# Patient Record
Sex: Male | Born: 1980 | Race: White | Hispanic: No | Marital: Single | Smoking: Current every day smoker
Health system: Southern US, Community
[De-identification: ages and names within clinical notes are randomized; demographics above are authoritative.]

## PROBLEM LIST (undated history)

## (undated) DIAGNOSIS — E119 Type 2 diabetes mellitus without complications: Secondary | ICD-10-CM

## (undated) HISTORY — PX: ANKLE SURGERY: SHX546

---

## 2005-02-04 ENCOUNTER — Inpatient Hospital Stay: Payer: Self-pay | Admitting: Otolaryngology

## 2005-12-26 ENCOUNTER — Emergency Department: Payer: Self-pay | Admitting: Emergency Medicine

## 2006-05-13 ENCOUNTER — Emergency Department: Payer: Self-pay | Admitting: Emergency Medicine

## 2018-07-10 ENCOUNTER — Encounter (HOSPITAL_COMMUNITY): Payer: Self-pay | Admitting: Emergency Medicine

## 2018-07-10 ENCOUNTER — Other Ambulatory Visit: Payer: Self-pay

## 2018-07-10 ENCOUNTER — Emergency Department (HOSPITAL_COMMUNITY): Payer: Self-pay

## 2018-07-10 ENCOUNTER — Emergency Department (HOSPITAL_COMMUNITY)
Admission: EM | Admit: 2018-07-10 | Discharge: 2018-07-10 | Disposition: A | Discharge status: Home / Self Care | Payer: Self-pay | Attending: Emergency Medicine | Admitting: Emergency Medicine

## 2018-07-10 DIAGNOSIS — M549 Dorsalgia, unspecified: Secondary | ICD-10-CM | POA: Insufficient documentation

## 2018-07-10 DIAGNOSIS — F17210 Nicotine dependence, cigarettes, uncomplicated: Secondary | ICD-10-CM | POA: Insufficient documentation

## 2018-07-10 DIAGNOSIS — R1011 Right upper quadrant pain: Secondary | ICD-10-CM | POA: Insufficient documentation

## 2018-07-10 LAB — CBC WITH DIFFERENTIAL/PLATELET
Basophils Absolute: 0.1 10*3/uL (ref 0.0–0.1)
Basophils Relative: 1 %
EOS PCT: 5 %
Eosinophils Absolute: 0.3 10*3/uL (ref 0.0–0.7)
HEMATOCRIT: 42.6 % (ref 39.0–52.0)
Hemoglobin: 14.6 g/dL (ref 13.0–17.0)
LYMPHS ABS: 1.9 10*3/uL (ref 0.7–4.0)
LYMPHS PCT: 26 %
MCH: 31 pg (ref 26.0–34.0)
MCHC: 34.3 g/dL (ref 30.0–36.0)
MCV: 90.4 fL (ref 78.0–100.0)
MONO ABS: 0.6 10*3/uL (ref 0.1–1.0)
Monocytes Relative: 8 %
Neutro Abs: 4.5 10*3/uL (ref 1.7–7.7)
Neutrophils Relative %: 60 %
PLATELETS: 265 10*3/uL (ref 150–400)
RBC: 4.71 MIL/uL (ref 4.22–5.81)
RDW: 13.1 % (ref 11.5–15.5)
WBC: 7.5 10*3/uL (ref 4.0–10.5)

## 2018-07-10 LAB — COMPREHENSIVE METABOLIC PANEL
ALBUMIN: 3.6 g/dL (ref 3.5–5.0)
ALT: 31 U/L (ref 0–44)
AST: 21 U/L (ref 15–41)
Alkaline Phosphatase: 72 U/L (ref 38–126)
Anion gap: 8 (ref 5–15)
BUN: 16 mg/dL (ref 6–20)
CHLORIDE: 110 mmol/L (ref 98–111)
CO2: 24 mmol/L (ref 22–32)
CREATININE: 1.14 mg/dL (ref 0.61–1.24)
Calcium: 8.8 mg/dL — ABNORMAL LOW (ref 8.9–10.3)
GFR calc Af Amer: >60 mL/min (ref >60)
GFR calc non Af Amer: >60 mL/min (ref >60)
Glucose, Bld: 185 mg/dL — ABNORMAL HIGH (ref 70–99)
Potassium: 4.3 mmol/L (ref 3.5–5.1)
SODIUM: 142 mmol/L (ref 135–145)
Total Bilirubin: 0.9 mg/dL (ref 0.3–1.2)
Total Protein: 7 g/dL (ref 6.5–8.1)

## 2018-07-10 LAB — URINALYSIS, ROUTINE W REFLEX MICROSCOPIC
Bilirubin Urine: NEGATIVE
Glucose, UA: NEGATIVE mg/dL
HGB URINE DIPSTICK: NEGATIVE
Ketones, ur: NEGATIVE mg/dL
Leukocytes, UA: NEGATIVE
Nitrite: NEGATIVE
PH: 5 (ref 5.0–8.0)
PROTEIN: NEGATIVE mg/dL
Specific Gravity, Urine: 1.019 (ref 1.005–1.030)

## 2018-07-10 LAB — LIPASE, BLOOD: Lipase: 31 U/L (ref 11–51)

## 2018-07-10 MED ORDER — CYCLOBENZAPRINE HCL 10 MG PO TABS: 10.0000 mg | ORAL_TABLET | Freq: Two times a day (BID) | ORAL | 0 refills | Status: DC | PRN

## 2018-07-10 MED ORDER — IBUPROFEN 800 MG PO TABS: 800.0000 mg | ORAL_TABLET | Freq: Three times a day (TID) | ORAL | 0 refills | Status: DC | PRN

## 2018-07-10 MED ORDER — FENTANYL CITRATE (PF) 100 MCG/2ML IJ SOLN
50.0000 ug | Freq: Once | INTRAMUSCULAR | Status: AC
  Administered 2018-07-10: 50 ug via INTRAVENOUS
  Filled 2018-07-10: qty 2

## 2018-07-10 MED ORDER — KETOROLAC TROMETHAMINE 15 MG/ML IJ SOLN
15.0000 mg | Freq: Once | INTRAMUSCULAR | Status: AC
  Administered 2018-07-10: 15 mg via INTRAMUSCULAR
  Filled 2018-07-10: qty 1

## 2018-07-10 NOTE — ED Provider Notes (Signed)
[facility]-EMERGENCY DEPT Provider Note   CSN: [id number] Arrival date & time: 07/10/18  1111     History   Chief Complaint Chief Complaint  Patient presents with  . Back Pain  . Abdominal Pain    HPI Chad MASSMAN Sr. is a 37 y.o. male.  The history is provided by the patient.  Abdominal Pain   This is a new problem. The current episode started more than 2 days ago. The problem occurs daily. The problem has not changed since onset.The pain is associated with an unknown factor. The pain is located in the RUQ (rigth CVA). The quality of the pain is aching and dull. The pain is at a severity of 4/10. The pain is moderate. Pertinent negatives include anorexia, fever, belching, diarrhea, flatus, hematochezia, melena, nausea, vomiting, constipation, dysuria, frequency, hematuria, headaches, arthralgias and myalgias. Nothing aggravates the symptoms. Nothing relieves the symptoms. Past workup does not include CT scan. His past medical history does not include GERD or ulcerative colitis.    History reviewed. No pertinent past medical history.  There are no active problems to display for this patient.   Past Surgical History:  Procedure Laterality Date  . ANKLE SURGERY Right         Home Medications    Prior to Admission medications   Medication Sig Start Date End Date Taking? Authorizing Provider  cyclobenzaprine (FLEXERIL) 10 MG tablet Take 1 tablet (10 mg total) by mouth 2 (two) times daily as needed for up to 20 doses for muscle spasms. 07/10/18   Yarieliz Wasser, DO  ibuprofen (ADVIL,MOTRIN) 800 MG tablet Take 1 tablet (800 mg total) by mouth every 8 (eight) hours as needed for up to 30 doses. 07/10/18   Virgina Norfolk, DO    Family History No family history on file.  Social History Social History   Tobacco Use  . Smoking status: Current Every Day Smoker    Types: Cigarettes  . Smokeless tobacco: Never Used  Substance Use Topics  . Alcohol use: Yes   . Drug use: Not on file     Allergies   Patient has no known allergies.   Review of Systems Review of Systems  Constitutional: Negative for chills and fever.  HENT: Negative for ear pain and sore throat.   Eyes: Negative for pain and visual disturbance.  Respiratory: Negative for cough and shortness of breath.   Cardiovascular: Negative for chest pain and palpitations.  Gastrointestinal: Positive for abdominal pain. Negative for anorexia, constipation, diarrhea, flatus, hematochezia, melena, nausea and vomiting.  Genitourinary: Negative for dysuria, frequency and hematuria.  Musculoskeletal: Negative for arthralgias, back pain and myalgias.  Skin: Negative for color change and rash.  Neurological: Negative for seizures, syncope and headaches.  All other systems reviewed and are negative.    Physical Exam Updated Vital Signs  ED Triage Vitals  Enc Vitals Group     BP 07/10/18 1117 (!) 143/87     Pulse Rate 07/10/18 1117 (!) 125     Resp 07/10/18 1117 20     Temp 07/10/18 1117 97.9 F (36.6 C)     Temp Source 07/10/18 1117 Oral     SpO2 07/10/18 1117 95 %     Weight 07/10/18 1117 280 lb (127 kg)     Height 07/10/18 1117 6' (1.829 m)     Head Circumference --      Peak Flow --      Pain Score 07/10/18 1120 9  Pain Loc --      Pain Edu? --      Excl. in GC? --     Physical Exam  Constitutional: He appears well-developed and well-nourished.  HENT:  Head: Normocephalic and atraumatic.  Mouth/Throat: No oropharyngeal exudate.  Eyes: Pupils are equal, round, and reactive to light. Conjunctivae and EOM are normal.  Neck: Neck supple.  Cardiovascular: Normal rate, regular rhythm, normal heart sounds and intact distal pulses.  No murmur heard. Pulmonary/Chest: Effort normal and breath sounds normal. No respiratory distress.  Abdominal: Soft. Normal appearance. There is tenderness in the right upper quadrant. There is CVA tenderness (rigth ).  Musculoskeletal: He  exhibits no edema.  Neurological: He is alert.  5+ out of 5 strength, normal sensation, normal gait, no drift  Skin: Skin is warm and dry. Capillary refill takes less than 2 seconds.  Psychiatric: He has a normal mood and affect.  Nursing note and vitals reviewed. No midline spinal pain, TTP to right paraspinal thoracic muscles  ED Treatments / Results  Labs (all labs ordered are listed, but only abnormal results are displayed) Labs Reviewed  COMPREHENSIVE METABOLIC PANEL - Abnormal; Notable for the following components:      Result Value   Glucose, Bld 185 (*)    Calcium 8.8 (*)    All other components within normal limits  LIPASE, BLOOD  CBC WITH DIFFERENTIAL/PLATELET  URINALYSIS, ROUTINE W REFLEX MICROSCOPIC    EKG None  Radiology Ct Renal Stone Study  Result Date: 07/10/2018 CLINICAL DATA:  Acute flank pain. EXAM: CT ABDOMEN AND PELVIS WITHOUT CONTRAST TECHNIQUE: Multidetector CT imaging of the abdomen and pelvis was performed following the standard protocol without IV contrast. COMPARISON:  None. FINDINGS: Lower chest: No acute abnormality. Hepatobiliary: No focal liver abnormality is seen. No gallstones, gallbladder wall thickening, or biliary dilatation. Pancreas: Unremarkable. No pancreatic ductal dilatation or surrounding inflammatory changes. Spleen: Normal in size without focal abnormality. Adrenals/Urinary Tract: Adrenal glands are unremarkable. Kidneys are normal, without renal calculi, focal lesion, or hydronephrosis. Bladder is unremarkable. Stomach/Bowel: Stomach is within normal limits. Appendix appears normal. No evidence of bowel wall thickening, distention, or inflammatory changes. Vascular/Lymphatic: No significant vascular findings are present. No enlarged abdominal or pelvic lymph nodes. Reproductive: Prostate is unremarkable. Other: No abdominal wall hernia or abnormality. No abdominopelvic ascites. Musculoskeletal: No acute or significant osseous findings.  IMPRESSION: No abnormality seen in the abdomen or pelvis. Electronically Signed   By: Lupita Raider, M.D.   On: 07/10/2018 13:11    Procedures Procedures (including critical care time)  Medications Ordered in ED Medications  fentaNYL (SUBLIMAZE) injection 50 mcg (50 mcg Intravenous Given 07/10/18 1157)  ketorolac (TORADOL) 15 MG/ML injection 15 mg (15 mg Intramuscular Given 07/10/18 1412)     Initial Impression / Assessment and Plan / ED Course  I have reviewed the triage vital signs and the nursing notes.  Pertinent labs & imaging results that were available during my care of the patient were reviewed by me and considered in my medical decision making (see chart for details).     Chad EWELL Sr. is a 37 year old male with no significant medical history who presents to the ED with right-sided flank pain, back pain.  Patient with mild tachycardia upon arrival but otherwise normal vitals.  Patient with right-sided flank pain back pain for the last several days.  Has not taken any medicine to help.  No history of kidney stones.  No urinary symptoms.  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  Patient has some tenderness in the right CVA area, right sided thoracic paraspinal muscles.  Patient with some tenderness in the right upper quadrant on exam but otherwise normal abdominal exam.  Patient with normal neurological exam.  Normal gait.  No midline spinal tenderness.  Concern for kidney stone versus pyelonephritis versus muscle skeletal pain.  Will order CT scan, basic labs, urinalysis.  Patient given IV fentanyl for pain.  Patient with no signs of kidney stone and overall unremarkable CT abdomen and pelvis.  No significant anemia, leukocytosis, kidney injury, electrolyte abnormality.  Patient felt better after IV fentanyl and was given IV Toradol.  Urinalysis showed no signs of infection.  Suspect patient likely with muscleskeletal pain and given prescription  for Motrin and Flexeril. HR improved with pain medications. Discharged from ED in good condition and recommend follow-up with primary care doctor and told to return to the ED if symptoms worsen.  This chart was dictated using voice recognition software.  Despite best efforts to proofread,  errors can occur which can change the documentation meaning.   Final Clinical Impressions(s) / ED Diagnoses   Final diagnoses:  Acute right-sided back pain, unspecified back location    ED Discharge Orders         Ordered    ibuprofen (ADVIL,MOTRIN) 800 MG tablet  Every 8 hours PRN     07/10/18 1438    cyclobenzaprine (FLEXERIL) 10 MG tablet  2 times daily PRN     07/10/18 1438           Aixa Corsello, DO 07/10/18 1439

## 2018-07-10 NOTE — ED Notes (Signed)
Pt reports that he has lower middle back pain x 6 days. Pt denies pain and burning with urination and n/v. Pt reports that pain is 9/10 sharp pain.

## 2018-07-10 NOTE — ED Triage Notes (Signed)
Pt c/o mid back pain since Thursday last week. Reports got worse last night. Denies urinary problems or falls.

## 2020-07-20 ENCOUNTER — Emergency Department
Admission: EM | Admit: 2020-07-20 | Discharge: 2020-07-20 | Disposition: A | Discharge status: Home / Self Care | Payer: Self-pay | Attending: Emergency Medicine | Admitting: Emergency Medicine

## 2020-07-20 ENCOUNTER — Other Ambulatory Visit: Payer: Self-pay

## 2020-07-20 ENCOUNTER — Encounter: Payer: Self-pay | Admitting: Emergency Medicine

## 2020-07-20 ENCOUNTER — Emergency Department: Payer: Self-pay

## 2020-07-20 DIAGNOSIS — F17210 Nicotine dependence, cigarettes, uncomplicated: Secondary | ICD-10-CM | POA: Insufficient documentation

## 2020-07-20 DIAGNOSIS — Z20822 Contact with and (suspected) exposure to covid-19: Secondary | ICD-10-CM | POA: Insufficient documentation

## 2020-07-20 DIAGNOSIS — R350 Frequency of micturition: Secondary | ICD-10-CM | POA: Insufficient documentation

## 2020-07-20 DIAGNOSIS — R112 Nausea with vomiting, unspecified: Secondary | ICD-10-CM | POA: Insufficient documentation

## 2020-07-20 DIAGNOSIS — E119 Type 2 diabetes mellitus without complications: Secondary | ICD-10-CM | POA: Insufficient documentation

## 2020-07-20 LAB — COMPREHENSIVE METABOLIC PANEL
ALT: 37 U/L (ref 0–44)
AST: 23 U/L (ref 15–41)
Albumin: 3.6 g/dL (ref 3.5–5.0)
Alkaline Phosphatase: 95 U/L (ref 38–126)
Anion gap: 13 (ref 5–15)
BUN: 9 mg/dL (ref 6–20)
CO2: 25 mmol/L (ref 22–32)
Calcium: 9 mg/dL (ref 8.9–10.3)
Chloride: 93 mmol/L — ABNORMAL LOW (ref 98–111)
Creatinine, Ser: 0.9 mg/dL (ref 0.61–1.24)
GFR calc Af Amer: >60 mL/min (ref >60)
GFR calc non Af Amer: >60 mL/min (ref >60)
Glucose, Bld: 543 mg/dL (ref 70–99)
Potassium: 4.8 mmol/L (ref 3.5–5.1)
Sodium: 131 mmol/L — ABNORMAL LOW (ref 135–145)
Total Bilirubin: 0.7 mg/dL (ref 0.3–1.2)
Total Protein: 7.6 g/dL (ref 6.5–8.1)

## 2020-07-20 LAB — URINALYSIS, COMPLETE (UACMP) WITH MICROSCOPIC
Bacteria, UA: NONE SEEN
Bilirubin Urine: NEGATIVE
Glucose, UA: >=500 mg/dL — AB
Hgb urine dipstick: NEGATIVE
Ketones, ur: NEGATIVE mg/dL
Leukocytes,Ua: NEGATIVE
Nitrite: NEGATIVE
Protein, ur: NEGATIVE mg/dL
Specific Gravity, Urine: 1.029 (ref 1.005–1.030)
pH: 6 (ref 5.0–8.0)

## 2020-07-20 LAB — CBC
HCT: 46.1 % (ref 39.0–52.0)
Hemoglobin: 15.5 g/dL (ref 13.0–17.0)
MCH: 30 pg (ref 26.0–34.0)
MCHC: 33.6 g/dL (ref 30.0–36.0)
MCV: 89.3 fL (ref 80.0–100.0)
Platelets: 266 10*3/uL (ref 150–400)
RBC: 5.16 MIL/uL (ref 4.22–5.81)
RDW: 12.5 % (ref 11.5–15.5)
WBC: 9.7 10*3/uL (ref 4.0–10.5)
nRBC: 0 % (ref 0.0–0.2)

## 2020-07-20 LAB — GLUCOSE, CAPILLARY: Glucose-Capillary: 245 mg/dL — ABNORMAL HIGH (ref 70–99)

## 2020-07-20 LAB — LIPASE, BLOOD: Lipase: 31 U/L (ref 11–51)

## 2020-07-20 LAB — SARS CORONAVIRUS 2 BY RT PCR (HOSPITAL ORDER, PERFORMED IN ~~LOC~~ HOSPITAL LAB): SARS Coronavirus 2: NEGATIVE

## 2020-07-20 MED ORDER — BLOOD GLUCOSE MONITOR KIT: PACK | 0 refills | Status: AC

## 2020-07-20 MED ORDER — SODIUM CHLORIDE 0.9 % IV BOLUS
1000.0000 mL | Freq: Once | INTRAVENOUS | Status: AC
  Administered 2020-07-20: 1000 mL via INTRAVENOUS

## 2020-07-20 MED ORDER — INSULIN ASPART 100 UNIT/ML ~~LOC~~ SOLN
10.0000 [IU] | Freq: Once | SUBCUTANEOUS | Status: AC
  Administered 2020-07-20: 10 [IU] via INTRAVENOUS
  Filled 2020-07-20: qty 1

## 2020-07-20 MED ORDER — ONDANSETRON HCL 4 MG/2ML IJ SOLN
4.0000 mg | Freq: Once | INTRAMUSCULAR | Status: AC
  Administered 2020-07-20: 4 mg via INTRAVENOUS
  Filled 2020-07-20: qty 2

## 2020-07-20 MED ORDER — METFORMIN HCL 1000 MG PO TABS: 1000.0000 mg | ORAL_TABLET | Freq: Two times a day (BID) | ORAL | 3 refills | Status: AC

## 2020-07-20 MED ORDER — KETOROLAC TROMETHAMINE 30 MG/ML IJ SOLN
30.0000 mg | Freq: Once | INTRAMUSCULAR | Status: AC
  Administered 2020-07-20: 30 mg via INTRAVENOUS
  Filled 2020-07-20: qty 1

## 2020-07-20 NOTE — ED Notes (Signed)
See triage note  States he developed some n/v/d since Friday  States he has not had any fever  Afebrile on arrival

## 2020-07-20 NOTE — ED Notes (Signed)
GLU CHECK 245

## 2020-07-20 NOTE — ED Provider Notes (Signed)
[facility] Emergency Department Provider Note  Time seen: 2:46 PM  I have reviewed the triage vital signs and the nursing notes.   HISTORY  Chief Complaint Body aches, weakness, left flank pain   HPI Chad HINOJOSA Sr. is a 39 y.o. male with no significant past medical history presents to the emergency department with multiple complaints including generalized weakness, body aches, left flank pain, nausea vomiting.  Patient also states urinary frequency but denies dysuria.  No known fever.  No cough or shortness of breath.  Has not been vaccinated against Covid.   History reviewed. No pertinent past medical history.  There are no problems to display for this patient.   Past Surgical History:  Procedure Laterality Date  . ANKLE SURGERY Right     Prior to Admission medications   Not on File    No Known Allergies  No family history on file.  Social History Social History   Tobacco Use  . Smoking status: Current Every Day Smoker    Types: Cigarettes  . Smokeless tobacco: Never Used  Vaping Use  . Vaping Use: Never used  Substance Use Topics  . Alcohol use: Yes  . Drug use: Not on file    Review of Systems Constitutional: Negative for fever.  Positive for generalized weakness.   Cardiovascular: Negative for chest pain. Respiratory: Negative for shortness of breath. Gastrointestinal: Mild dull left flank pain.  Positive for nausea vomiting.  Positive for diarrhea. Genitourinary: Urinary frequency without dysuria Musculoskeletal: Negative for musculoskeletal complaints Neurological: Negative for headache All other ROS negative  ____________________________________________   PHYSICAL EXAM:  VITAL SIGNS: ED Triage Vitals  Enc Vitals Group     BP 07/20/20 1301 131/84     Pulse Rate 07/20/20 1301 (!) 101     Resp 07/20/20 1301 20     Temp 07/20/20 1301 98.4 F (36.9 C)     Temp Source 07/20/20 1301 Oral     SpO2 07/20/20 1301 96 %      Weight 07/20/20 1302 295 lb (133.8 kg)     Height 07/20/20 1302 6' (1.829 m)     Head Circumference --      Peak Flow --      Pain Score 07/20/20 1302 8     Pain Loc --      Pain Edu? --      Excl. in GC? --    Constitutional: Alert and oriented. Well appearing and in no distress. Eyes: Normal exam ENT      Head: Normocephalic and atraumatic.      Mouth/Throat: Mucous membranes are moist. Cardiovascular: Normal rate, regular rhythm.  Respiratory: Normal respiratory effort without tachypnea nor retractions. Breath sounds are clear  Gastrointestinal: Soft, mild left abdominal tenderness palpation without rebound guarding or distention. Musculoskeletal: Nontender with normal range of motion in all extremities.  Neurologic:  Normal speech and language. No gross focal neurologic deficits Skin:  Skin is warm, dry and intact.  Psychiatric: Mood and affect are normal.   ____________________________________________     RADIOLOGY  CT negative for acute abnormality  ____________________________________________   INITIAL IMPRESSION / ASSESSMENT AND PLAN / ED COURSE  Pertinent labs & imaging results that were available during my care of the patient were reviewed by me and considered in my medical decision making (see chart for details).   Patient presents to the emergency department with symptoms of generalized weakness fatigue urinary frequency body aches nausea vomiting.  Patient's labs show significant  hyperglycemia greater than 500, no known history of diabetes previously.  Patient states he has been nauseated and vomiting today has not eaten.  Highly suspect new onset diabetes to be the cause of the patient's symptoms.  Given the patient's mild left-sided tenderness palpation with left flank pain we will also obtain a CT renal scan to further evaluate.  We will treat with IV fluids, nausea medication, pain medication and continue to reassess.  Reassuringly patient's anion gap is normal  and urinalysis shows no ketones.  Patient's work-up is overall reassuring.  Blood glucose is down to 245.  We will start the patient on Metformin 1000 mg twice daily.  We will have the patient follow-up with the PCP.  I discussed diabetic diet as well as return precautions.  Chad NEZ Sr. was evaluated in Emergency Department on 07/20/2020 for the symptoms described in the history of present illness. He was evaluated in the context of the global COVID-19 pandemic, which necessitated consideration that the patient might be at risk for infection with the SARS-CoV-2 virus that causes COVID-19. Institutional protocols and algorithms that pertain to the evaluation of patients at risk for COVID-19 are in a state of rapid change based on information released by regulatory bodies including the CDC and federal and state organizations. These policies and algorithms were followed during the patient's care in the ED.  ____________________________________________   FINAL CLINICAL IMPRESSION(S) / ED DIAGNOSES  New onset diabetes   Minna Antis, MD 07/20/20 1742

## 2020-07-20 NOTE — ED Triage Notes (Signed)
Patient to ER for c/o lower left abd pain with vomiting since yesterday. Patient unsure of how many episodes he has had. Denies any fever. +Diarrhea x2-3 episodes.

## 2021-03-10 ENCOUNTER — Other Ambulatory Visit: Payer: Self-pay

## 2021-03-10 ENCOUNTER — Emergency Department: Payer: Self-pay

## 2021-03-10 ENCOUNTER — Emergency Department
Admission: EM | Admit: 2021-03-10 | Discharge: 2021-03-10 | Disposition: A | Discharge status: Home / Self Care | Payer: Self-pay | Attending: Emergency Medicine | Admitting: Emergency Medicine

## 2021-03-10 ENCOUNTER — Encounter: Payer: Self-pay | Admitting: Emergency Medicine

## 2021-03-10 DIAGNOSIS — U071 COVID-19: Secondary | ICD-10-CM | POA: Insufficient documentation

## 2021-03-10 DIAGNOSIS — B349 Viral infection, unspecified: Secondary | ICD-10-CM

## 2021-03-10 DIAGNOSIS — H53149 Visual discomfort, unspecified: Secondary | ICD-10-CM | POA: Insufficient documentation

## 2021-03-10 DIAGNOSIS — E119 Type 2 diabetes mellitus without complications: Secondary | ICD-10-CM | POA: Insufficient documentation

## 2021-03-10 DIAGNOSIS — Z28310 Unvaccinated for covid-19: Secondary | ICD-10-CM | POA: Insufficient documentation

## 2021-03-10 DIAGNOSIS — Z7984 Long term (current) use of oral hypoglycemic drugs: Secondary | ICD-10-CM | POA: Insufficient documentation

## 2021-03-10 DIAGNOSIS — F17210 Nicotine dependence, cigarettes, uncomplicated: Secondary | ICD-10-CM | POA: Insufficient documentation

## 2021-03-10 DIAGNOSIS — R062 Wheezing: Secondary | ICD-10-CM

## 2021-03-10 HISTORY — DX: Type 2 diabetes mellitus without complications: E11.9

## 2021-03-10 LAB — CBC WITH DIFFERENTIAL/PLATELET
Abs Immature Granulocytes: 0.03 10*3/uL (ref 0.00–0.07)
Basophils Absolute: 0 10*3/uL (ref 0.0–0.1)
Basophils Relative: 1 %
Eosinophils Absolute: 0.6 10*3/uL — ABNORMAL HIGH (ref 0.0–0.5)
Eosinophils Relative: 10 %
HCT: 43.6 % (ref 39.0–52.0)
Hemoglobin: 14.7 g/dL (ref 13.0–17.0)
Immature Granulocytes: 1 %
Lymphocytes Relative: 35 %
Lymphs Abs: 2.3 10*3/uL (ref 0.7–4.0)
MCH: 30.2 pg (ref 26.0–34.0)
MCHC: 33.7 g/dL (ref 30.0–36.0)
MCV: 89.7 fL (ref 80.0–100.0)
Monocytes Absolute: 0.6 10*3/uL (ref 0.1–1.0)
Monocytes Relative: 9 %
Neutro Abs: 3 10*3/uL (ref 1.7–7.7)
Neutrophils Relative %: 44 %
Platelets: 192 10*3/uL (ref 150–400)
RBC: 4.86 MIL/uL (ref 4.22–5.81)
RDW: 12.9 % (ref 11.5–15.5)
WBC: 6.5 10*3/uL (ref 4.0–10.5)
nRBC: 0 % (ref 0.0–0.2)

## 2021-03-10 LAB — COMPREHENSIVE METABOLIC PANEL
ALT: 26 U/L (ref 0–44)
AST: 19 U/L (ref 15–41)
Albumin: 3.3 g/dL — ABNORMAL LOW (ref 3.5–5.0)
Alkaline Phosphatase: 51 U/L (ref 38–126)
Anion gap: 9 (ref 5–15)
BUN: 13 mg/dL (ref 6–20)
CO2: 24 mmol/L (ref 22–32)
Calcium: 8.7 mg/dL — ABNORMAL LOW (ref 8.9–10.3)
Chloride: 104 mmol/L (ref 98–111)
Creatinine, Ser: 0.72 mg/dL (ref 0.61–1.24)
GFR, Estimated: >60 mL/min (ref >60)
Glucose, Bld: 121 mg/dL — ABNORMAL HIGH (ref 70–99)
Potassium: 4.6 mmol/L (ref 3.5–5.1)
Sodium: 137 mmol/L (ref 135–145)
Total Bilirubin: 0.7 mg/dL (ref 0.3–1.2)
Total Protein: 6.6 g/dL (ref 6.5–8.1)

## 2021-03-10 LAB — TROPONIN I (HIGH SENSITIVITY): Troponin I (High Sensitivity): 4 ng/L (ref <18)

## 2021-03-10 MED ORDER — ACETAMINOPHEN 500 MG PO TABS
1000.0000 mg | ORAL_TABLET | Freq: Once | ORAL | Status: AC
  Administered 2021-03-10: 1000 mg via ORAL
  Filled 2021-03-10: qty 2

## 2021-03-10 MED ORDER — PREDNISONE 50 MG PO TABS: 50.0000 mg | ORAL_TABLET | Freq: Every day | ORAL | 0 refills | Status: AC

## 2021-03-10 MED ORDER — PREDNISONE 50 MG PO TABS: 50.0000 mg | ORAL_TABLET | Freq: Every day | ORAL | 0 refills | Status: DC

## 2021-03-10 MED ORDER — ALBUTEROL SULFATE HFA 108 (90 BASE) MCG/ACT IN AERS: 2.0000 | INHALATION_SPRAY | Freq: Four times a day (QID) | RESPIRATORY_TRACT | 2 refills | Status: DC | PRN

## 2021-03-10 MED ORDER — IPRATROPIUM-ALBUTEROL 0.5-2.5 (3) MG/3ML IN SOLN
3.0000 mL | Freq: Once | RESPIRATORY_TRACT | Status: AC
  Administered 2021-03-10: 3 mL via RESPIRATORY_TRACT
  Filled 2021-03-10: qty 3

## 2021-03-10 MED ORDER — LACTATED RINGERS IV BOLUS
1000.0000 mL | Freq: Once | INTRAVENOUS | Status: AC
  Administered 2021-03-10: 1000 mL via INTRAVENOUS

## 2021-03-10 MED ORDER — KETOROLAC TROMETHAMINE 30 MG/ML IJ SOLN
15.0000 mg | Freq: Once | INTRAMUSCULAR | Status: AC
  Administered 2021-03-10: 15 mg via INTRAVENOUS
  Filled 2021-03-10: qty 1

## 2021-03-10 MED ORDER — METHYLPREDNISOLONE SODIUM SUCC 125 MG IJ SOLR
125.0000 mg | Freq: Once | INTRAMUSCULAR | Status: AC
  Administered 2021-03-10: 125 mg via INTRAVENOUS
  Filled 2021-03-10: qty 2

## 2021-03-10 MED ORDER — ALBUTEROL SULFATE HFA 108 (90 BASE) MCG/ACT IN AERS: 2.0000 | INHALATION_SPRAY | Freq: Four times a day (QID) | RESPIRATORY_TRACT | 2 refills | Status: AC | PRN

## 2021-03-10 NOTE — ED Provider Notes (Signed)
[facility] Emergency Department Provider Note ____________________________________________   Event Date/Time   First MD Initiated Contact with Patient 03/10/21 0820     (approximate)  I have reviewed the triage vital signs and the nursing notes.  HISTORY  Chief Complaint Headache   HPI Chad BECKOM Sr. is a 40 y.o. malewho presents to the ED for evaluation of headache.   Chart review indicates hx DM, obesity.  Patient reports a previous smoking history with about 20 PPD.  No inhalers.  Patient is not vaccinated for COVID-19.  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   Patient reports developing global headache and photophobia 4 days ago while he was traveling to a neighboring state.  He primarily reports a sensation of "feeling like crap" alongside his headache.  He reports shortness of breath with exertion and occasional cough without production of increased sputum.  Reports intermittent chest discomfort, as recently as 4 hours ago while he was laying in bed this morning.  Denies fevers, syncopal episodes, falls, neck stiffness, known COVID contacts.  Reports using Excedrin with minimal relief of his headache, due to this poor relief and his continued symptoms, he presents to the ED for evaluation.   Past Medical History:  Diagnosis Date  . Diabetes mellitus without complication ([place])     There are no problems to display for this patient.   Past Surgical History:  Procedure Laterality Date  . ANKLE SURGERY Right     Prior to Admission medications   Medication Sig Start Date End Date Taking? Authorizing Provider  albuterol (VENTOLIN HFA) 108 (90 Base) MCG/ACT inhaler Inhale 2 puffs into the lungs every 6 (six) hours as needed for wheezing or shortness of breath. 03/10/21   Vladimir Crofts, MD  blood glucose meter kit and supplies KIT Dispense based on patient and insurance preference. Use up to four times daily as directed.  (FOR ICD-9 250.00, 250.01). 07/20/20   Harvest Dark, MD  metFORMIN (GLUCOPHAGE) 1000 MG tablet Take 1 tablet (1,000 mg total) by mouth 2 (two) times daily with a meal. 07/20/20 11/17/20  Harvest Dark, MD  predniSONE (DELTASONE) 50 MG tablet Take 1 tablet (50 mg total) by mouth daily for 4 days. 03/10/21 03/14/21  Vladimir Crofts, MD    Allergies Patient has no known allergies.  No family history on file.  Social History Social History   Tobacco Use  . Smoking status: Current Every Day Smoker    Types: Cigarettes  . Smokeless tobacco: Never Used  Vaping Use  . Vaping Use: Never used  Substance Use Topics  . Alcohol use: Yes    Review of Systems  Constitutional: No fever/chills Eyes: No visual changes. ENT: No sore throat. Cardiovascular: Positive for chest pain. Respiratory: Positive for cough and shortness of breath. Gastrointestinal: No abdominal pain.  No nausea, no vomiting.  No diarrhea.  No constipation. Genitourinary: Negative for dysuria. Musculoskeletal: Negative for back pain. Skin: Negative for rash. Neurological: Negative for  focal weakness or numbness.  Positive for headache  ____________________________________________   PHYSICAL EXAM:  VITAL SIGNS: Vitals:   03/10/21 1030 03/10/21 1115  BP: (!) 134/97 (!) 136/93  Pulse: 69 68  Resp: 15 16  Temp:    SpO2: 97% 94%     Constitutional: Alert and oriented. Well appearing and in no acute distress.  Pleasant and conversational in full sentences. Eyes: Conjunctivae are normal. PERRL. EOMI. Head: Atraumatic. Nose: No congestion/rhinnorhea. Mouth/Throat: Mucous membranes are moist.  Oropharynx  non-erythematous. Neck: No stridor. No cervical spine tenderness to palpation. Cardiovascular: Normal rate, regular rhythm. Grossly normal heart sounds.  Good peripheral circulation. Respiratory: Normal respiratory effort.  Diffuse expiratory wheezes, prolonged expiratory phase and slight decrease in air  movement throughout.  No focal features. Gastrointestinal: Soft , nondistended, nontender to palpation. No CVA tenderness. Musculoskeletal: No lower extremity tenderness nor edema.  No joint effusions. No signs of acute trauma. Neurologic:  Normal speech and language. No gross focal neurologic deficits are appreciated. No gait instability noted. Skin:  Skin is warm, dry and intact. No rash noted. Psychiatric: Mood and affect are normal. Speech and behavior are normal.  ____________________________________________   LABS (all labs ordered are listed, but only abnormal results are displayed)  Labs Reviewed  CBC WITH DIFFERENTIAL/PLATELET - Abnormal; Notable for the following components:      Result Value   Eosinophils Absolute 0.6 (*)    All other components within normal limits  COMPREHENSIVE METABOLIC PANEL - Abnormal; Notable for the following components:   Glucose, Bld 121 (*)    Calcium 8.7 (*)    Albumin 3.3 (*)    All other components within normal limits  SARS CORONAVIRUS 2 (TAT 6-24 HRS)  TROPONIN I (HIGH SENSITIVITY)   ____________________________________________  12 Lead EKG Sinus rhythm, rate of 71 bpm.  Normal axis and normal intervals.  No evidence of acute ischemia.  ____________________________________________  RADIOLOGY  ED MD interpretation: 2 view CXR reviewed by me without evidence of acute cardiopulmonary pathology. Initial CXR reviewed by me with possible RUL nodule, no longer on the second film.  Official radiology report(s): DG Chest 2 View  Result Date: 03/10/2021 CLINICAL DATA:  Re-evaluate pulmonary nodule.  Repeat chest x-ray. EXAM: CHEST - 2 VIEW COMPARISON:  03/10/2021 earlier same day FINDINGS: The heart size and mediastinal contours are within normal limits. Both lungs are clear. The visualized skeletal structures are unremarkable. IMPRESSION: No active cardiopulmonary disease. No pulmonary nodule seen on the current exam. Electronically Signed    By: Kathreen Devoid   On: 03/10/2021 11:23   DG Chest 2 View  Result Date: 03/10/2021 CLINICAL DATA:  Chest pain and shortness of breath EXAM: CHEST - 2 VIEW COMPARISON:  None. FINDINGS: 12 mm nodular density over the right upper chest, near an EKG lead. This is not localized on the lateral view. There is no edema, consolidation, effusion, or pneumothorax. Normal heart size and mediastinal contours. Spondylosis. IMPRESSION: 12 mm nodule over the right upper chest, suspicious but near an EKG lead. Recommend two-view chest x-ray follow-up with hardware removed. If good clinical follow-up, this would preferably be done after resolution of the patient's respiratory illness. No consolidation to suggest pneumonia. Electronically Signed   By: Monte Fantasia M.D.   On: 03/10/2021 10:09    ____________________________________________   PROCEDURES and INTERVENTIONS  Procedure(s) performed (including Critical Care):  .1-3 Lead EKG Interpretation Performed by: Vladimir Crofts, MD Authorized by: Vladimir Crofts, MD     Interpretation: normal     ECG rate:  84   ECG rate assessment: normal     Rhythm: sinus rhythm     Ectopy: none     Conduction: normal      Medications  lactated ringers bolus 1,000 mL (0 mLs Intravenous Stopped 03/10/21 1046)  ipratropium-albuterol (DUONEB) 0.5-2.5 (3) MG/3ML nebulizer solution 3 mL (3 mLs Nebulization Given 03/10/21 0902)  methylPREDNISolone sodium succinate (SOLU-MEDROL) 125 mg/2 mL injection 125 mg (125 mg Intravenous Given 03/10/21 0903)  acetaminophen (TYLENOL) tablet  1,000 mg (1,000 mg Oral Given 03/10/21 0903)  ketorolac (TORADOL) 30 MG/ML injection 15 mg (15 mg Intravenous Given 03/10/21 0903)    ____________________________________________   MDM / ED COURSE   40 year old patient with history of cigarette smoking presents to the ED with headache, chest pain or shortness of breath.  He is not vaccinated for COVID-19 I suspect a viral syndrome precipitating  wheezing, causing his thoracic discomfort.  We discussed swabbing for COVID-19, treatment of his wheezing and headache.  We discussed screening labs and CXR.  Patient is agreeable.  Patient with improving symptoms after breathing treatment and steroids, anticipated degree of undiagnosed COPD considering his smoking history.  Provided prescription for albuterol and steroid burst.  We discussed pending COVID swab.  Return precautions for the ED were discussed.  Suspect viral syndrome precipitating his wheezing.   Clinical Course as of 03/10/21 1620  Wed Mar 10, 2021  1040 Reassessed.  Improved breath sounds on auscultation.  I discussed the patient's CXR findings and my recommendation for repeat film, he is agreeable.  We discussed outpatient management thereafter, he is agreeable. [DS]    Clinical Course User Index [DS] Vladimir Crofts, MD    ____________________________________________   FINAL CLINICAL IMPRESSION(S) / ED DIAGNOSES  Final diagnoses:  Viral syndrome  Wheezing     ED Discharge Orders         Ordered    predniSONE (DELTASONE) 50 MG tablet  Daily,   Status:  Discontinued        03/10/21 1127    albuterol (VENTOLIN HFA) 108 (90 Base) MCG/ACT inhaler  Every 6 hours PRN,   Status:  Discontinued        03/10/21 1127    albuterol (VENTOLIN HFA) 108 (90 Base) MCG/ACT inhaler  Every 6 hours PRN        03/10/21 1211    predniSONE (DELTASONE) 50 MG tablet  Daily        03/10/21 1211           Chemere Steffler   Note:  This document was prepared using Systems analyst and may include unintentional dictation errors.   Vladimir Crofts, MD 03/10/21 [phone]

## 2021-03-10 NOTE — ED Notes (Signed)
Pt to ED c/o HA and body aches since 4d. Pt has tried tylenol, ibuprophen, excedrin with no relief. Pt in NAD. Rates HA as 8/10.

## 2021-03-10 NOTE — ED Triage Notes (Signed)
C/O headache and body aches since Saturday

## 2021-03-10 NOTE — Discharge Instructions (Signed)
Use Tylenol for pain and fevers.  Up to 1000 mg per dose, up to 4 times per day.  Do not take more than 4000 mg of Tylenol/acetaminophen within 24 hours.. Use naproxen/Aleve for anti-inflammatory pain relief. Use up to 500mg  every 12 hours. Do not take more frequently than this. Do not use other NSAIDs (ibuprofen, Advil) while taking this medication. It is safe to take Tylenol with this.   You are being discharged with a prescription for steroids/prednisone to take once daily starting tomorrow to finish a 5-day course of steroids to help relax your lungs.  Also being discharged with an albuterol inhaler to use as needed every 4-6 hours for sensation of wheezing or shortness of breath.  Check your COVID results on your MyChart later this evening  Return to the ED with any further worsening symptoms despite these measures

## 2021-03-11 LAB — SARS CORONAVIRUS 2 (TAT 6-24 HRS): SARS Coronavirus 2: POSITIVE — AB

## 2022-09-24 IMAGING — CR DG CHEST 2V
3 series · 3 of 3 positions shown · non-contrast
Comparison: None.

CLINICAL DATA: Chest pain and shortness of breath

EXAM:
CHEST - 2 VIEW

[chest pa]
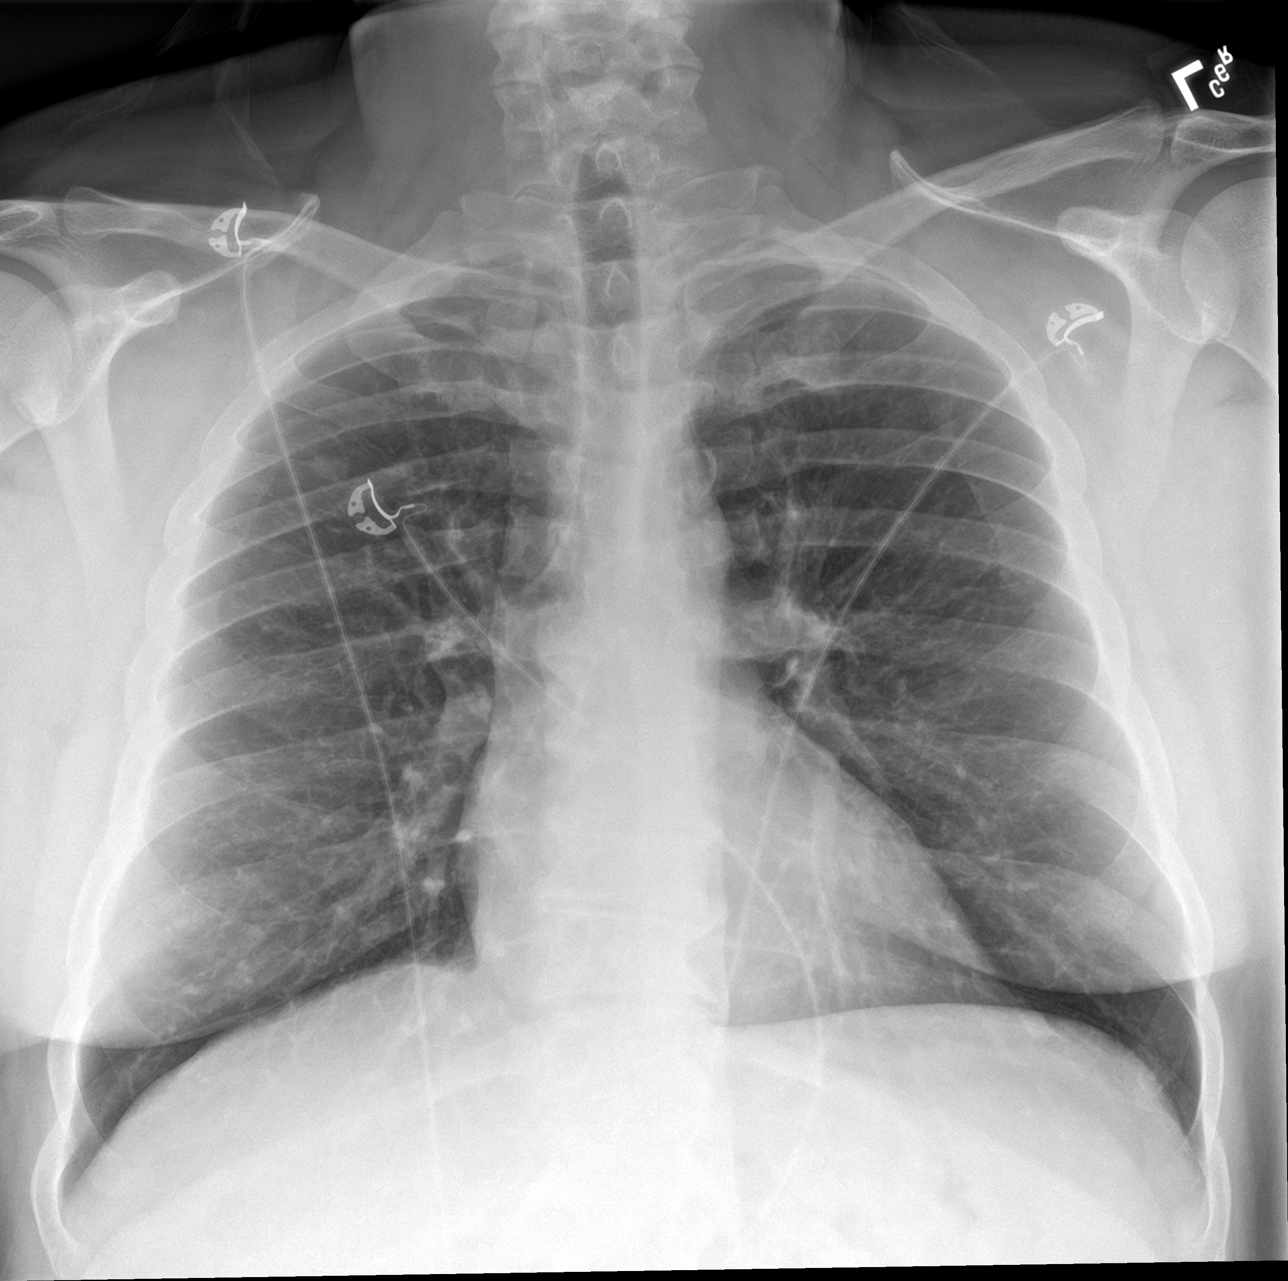

[chest lat (1 of 2)]
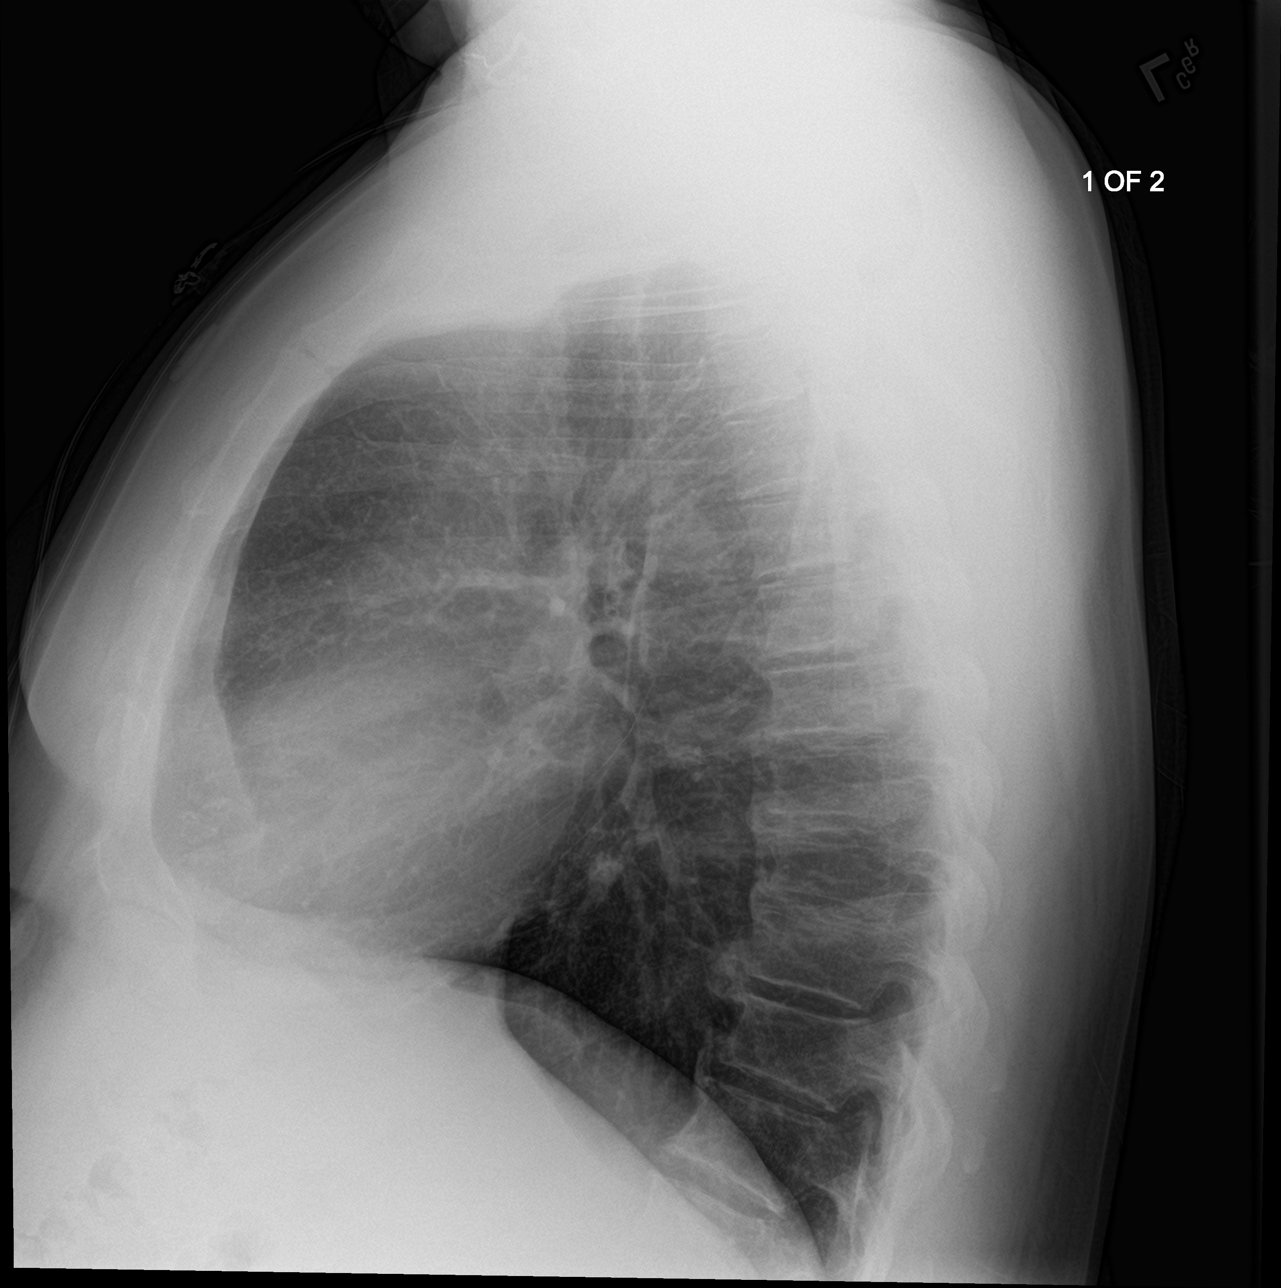

[chest lat (2 of 2)]
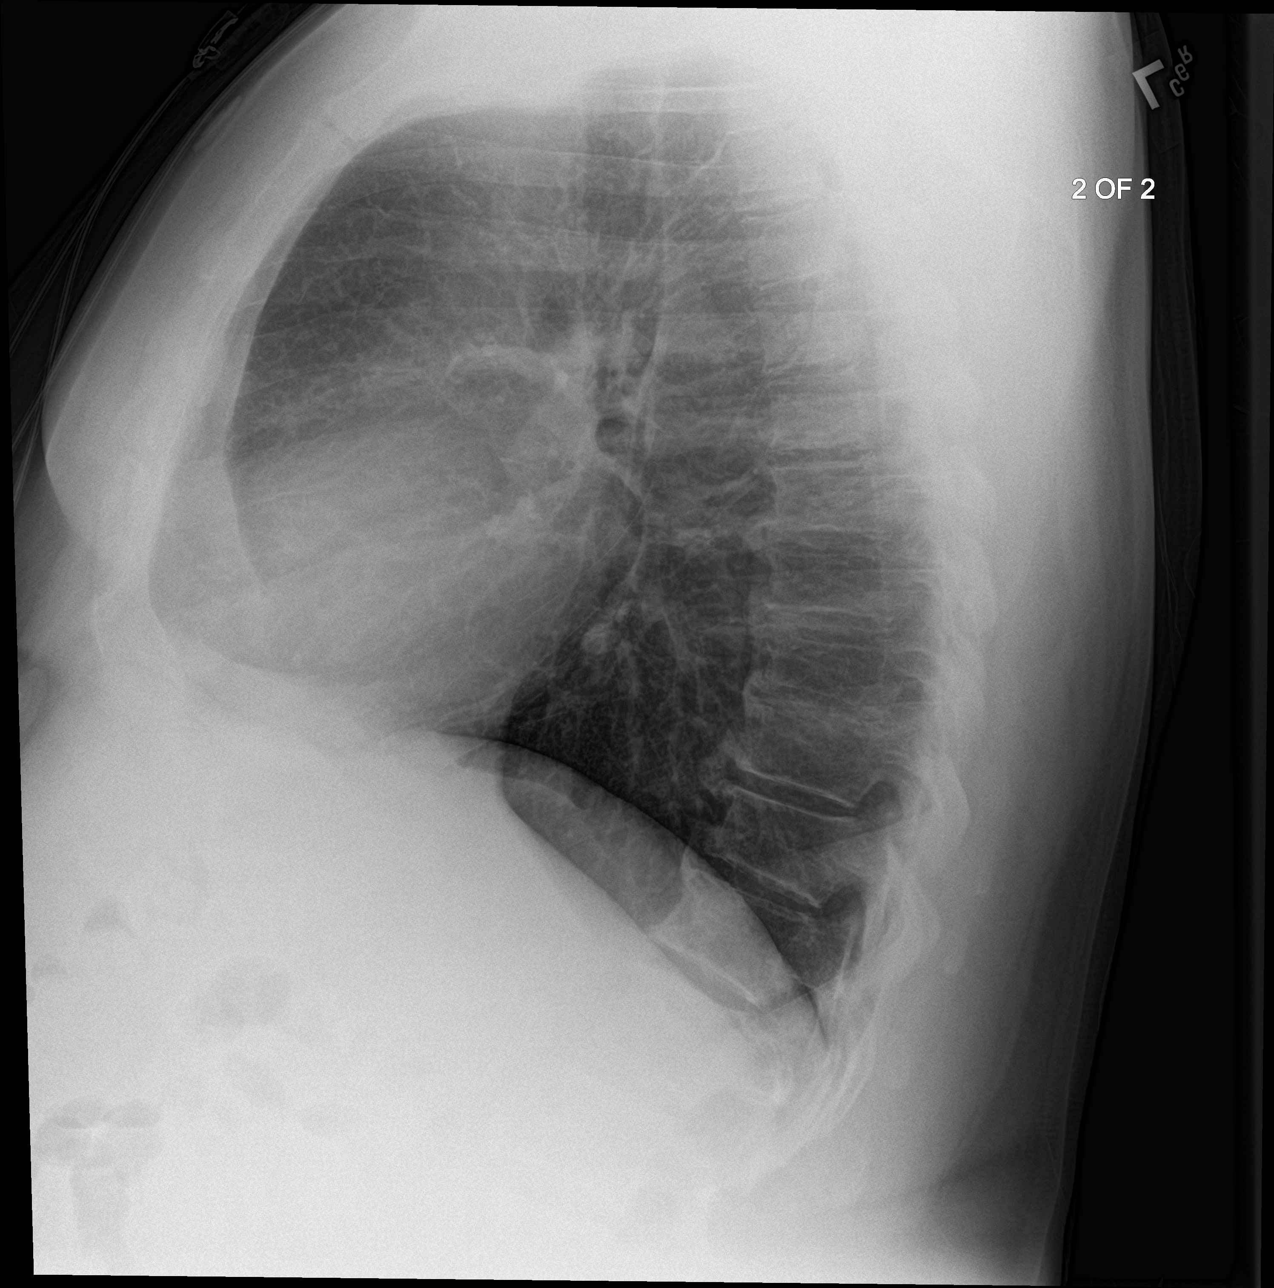

[3 of 3 positions shown; findings below may reference images not displayed]

FINDINGS: 12 mm nodular density over the right upper chest, near an EKG lead.
This is not localized on the lateral view. There is no edema,
consolidation, effusion, or pneumothorax. Normal heart size and
mediastinal contours. Spondylosis.
IMPRESSION: 12 mm nodule over the right upper chest, suspicious but near an EKG
lead. Recommend two-view chest x-ray follow-up with hardware
removed. If good clinical follow-up, this would preferably be done
after resolution of the patient's respiratory illness.

No consolidation to suggest pneumonia.

## 2023-03-22 ENCOUNTER — Other Ambulatory Visit: Payer: Self-pay

## 2023-03-22 ENCOUNTER — Emergency Department: Payer: Self-pay

## 2023-03-22 ENCOUNTER — Emergency Department
Admission: EM | Admit: 2023-03-22 | Discharge: 2023-03-22 | Disposition: A | Discharge status: Home / Self Care | Payer: Self-pay | Attending: Emergency Medicine | Admitting: Emergency Medicine

## 2023-03-22 DIAGNOSIS — R1031 Right lower quadrant pain: Secondary | ICD-10-CM | POA: Insufficient documentation

## 2023-03-22 DIAGNOSIS — R1030 Lower abdominal pain, unspecified: Secondary | ICD-10-CM

## 2023-03-22 DIAGNOSIS — K625 Hemorrhage of anus and rectum: Secondary | ICD-10-CM | POA: Insufficient documentation

## 2023-03-22 LAB — CBC WITH DIFFERENTIAL/PLATELET
Abs Immature Granulocytes: 0.02 10*3/uL (ref 0.00–0.07)
Basophils Absolute: 0.1 10*3/uL (ref 0.0–0.1)
Basophils Relative: 1 %
Eosinophils Absolute: 0.4 10*3/uL (ref 0.0–0.5)
Eosinophils Relative: 3 %
HCT: 51.9 % (ref 39.0–52.0)
Hemoglobin: 17.2 g/dL — ABNORMAL HIGH (ref 13.0–17.0)
Immature Granulocytes: 0 %
Lymphocytes Relative: 22 %
Lymphs Abs: 2.3 10*3/uL (ref 0.7–4.0)
MCH: 29.7 pg (ref 26.0–34.0)
MCHC: 33.1 g/dL (ref 30.0–36.0)
MCV: 89.5 fL (ref 80.0–100.0)
Monocytes Absolute: 0.7 10*3/uL (ref 0.1–1.0)
Monocytes Relative: 6 %
Neutro Abs: 6.8 10*3/uL (ref 1.7–7.7)
Neutrophils Relative %: 68 %
Platelets: 259 10*3/uL (ref 150–400)
RBC: 5.8 MIL/uL (ref 4.22–5.81)
RDW: 12.5 % (ref 11.5–15.5)
WBC: 10.3 10*3/uL (ref 4.0–10.5)
nRBC: 0 % (ref 0.0–0.2)

## 2023-03-22 LAB — COMPREHENSIVE METABOLIC PANEL
ALT: 31 U/L (ref 0–44)
AST: 12 U/L — ABNORMAL LOW (ref 15–41)
Albumin: 4.5 g/dL (ref 3.5–5.0)
Alkaline Phosphatase: 74 U/L (ref 38–126)
Anion gap: 8 (ref 5–15)
BUN: 23 mg/dL — ABNORMAL HIGH (ref 6–20)
CO2: 22 mmol/L (ref 22–32)
Calcium: 9.2 mg/dL (ref 8.9–10.3)
Chloride: 106 mmol/L (ref 98–111)
Creatinine, Ser: 1.14 mg/dL (ref 0.61–1.24)
GFR, Estimated: >60 mL/min (ref >60)
Glucose, Bld: 161 mg/dL — ABNORMAL HIGH (ref 70–99)
Potassium: 4.6 mmol/L (ref 3.5–5.1)
Sodium: 136 mmol/L (ref 135–145)
Total Bilirubin: 0.9 mg/dL (ref 0.3–1.2)
Total Protein: 8 g/dL (ref 6.5–8.1)

## 2023-03-22 LAB — LIPASE, BLOOD: Lipase: 31 U/L (ref 11–51)

## 2023-03-22 MED ORDER — OMEPRAZOLE MAGNESIUM 20 MG PO TBEC: 20.0000 mg | DELAYED_RELEASE_TABLET | Freq: Every day | ORAL | 1 refills | Status: AC

## 2023-03-22 MED ORDER — IOHEXOL 350 MG/ML SOLN
100.0000 mL | Freq: Once | INTRAVENOUS | Status: AC | PRN
  Administered 2023-03-22: 100 mL via INTRAVENOUS

## 2023-03-22 NOTE — ED Triage Notes (Signed)
Pt to ED for rectal bleeding, abd pain for 2 days. Denies n/v/d

## 2023-03-22 NOTE — ED Provider Notes (Signed)
[facility] Provider Note    Event Date/Time   First MD Initiated Contact with Patient 03/22/23 2202     (approximate)   History   Abdominal Pain and Rectal Bleeding   HPI Chad SCHUMACKER Sr. is a 42 y.o. male who reports no chronic medical issues but who does not go to the doctor regularly.  He presents for evaluation of abdominal pain has been coming and going for couple weeks but has been more present over the last couple of days.  He has also had intermittent rectal bleeding for the last few weeks.  No nausea or vomiting.  No fever.  No chest pain or shortness of breath.  No history of any intra-abdominal issues and no prior surgeries.     Physical Exam   Triage Vital Signs: ED Triage Vitals  Enc Vitals Group     BP 03/22/23 1721 (!) 136/116     Pulse Rate 03/22/23 1721 88     Resp 03/22/23 1720 18     Temp 03/22/23 1720 98 F (36.7 C)     Temp src --      SpO2 03/22/23 1721 96 %     Weight 03/22/23 1720 124.7 kg (275 lb)     Height 03/22/23 1720 1.829 m (6')     Head Circumference --      Peak Flow --      Pain Score 03/22/23 1720 8     Pain Loc --      Pain Edu? --      Excl. in GC? --     Most recent vital signs: Vitals:   03/22/23 1721 03/22/23 2332  BP: (!) 136/116 (!) 134/98  Pulse: 88 89  Resp:    Temp:  98.2 F (36.8 C)  SpO2: 96% 97%    General: Awake, no distress.  CV:  Good peripheral perfusion.  Resp:  Normal effort. Speaking easily and comfortably, no accessory muscle usage nor intercostal retractions.   Abd:  No distention.  Mild tenderness to palpation in the right lower quadrant with some guarding but no rebound.  No upper abdominal tenderness including the epigastrium nor right upper quadrant.  Rectal exam is notable for minimal stool in the rectum which is Hemoccult negative.  No external hemorrhoids, no inappropriate tenderness on digital rectal exam.   ED Results / Procedures / Treatments   Labs (all labs  ordered are listed, but only abnormal results are displayed) Labs Reviewed  COMPREHENSIVE METABOLIC PANEL - Abnormal; Notable for the following components:      Result Value   Glucose, Bld 161 (*)    BUN 23 (*)    AST 12 (*)    All other components within normal limits  CBC WITH DIFFERENTIAL/PLATELET - Abnormal; Notable for the following components:   Hemoglobin 17.2 (*)    All other components within normal limits  LIPASE, BLOOD  URINALYSIS, ROUTINE W REFLEX MICROSCOPIC     RADIOLOGY I viewed and interpreted the patient's CT of the abdomen and pelvis.  No evidence of appendicitis nor diverticulitis.  I also read the radiologist's report, which confirmed no acute findings.   PROCEDURES:  Critical Care performed: No  Procedures    IMPRESSION / MDM / ASSESSMENT AND PLAN / ED COURSE  I reviewed the triage vital signs and the nursing notes.  Differential diagnosis includes, but is not limited to, appendicitis, diverticulitis, acid reflux, mesenteric adenitis, biliary colic.  Patient's presentation is most consistent with acute presentation with potential threat to life or bodily function.  Labs/studies ordered: CT abdomen/pelvis, CMP, lipase, CBC with differential  Interventions/Medications given:  Medications  iohexol (OMNIPAQUE) 350 MG/ML injection 100 mL (100 mLs Intravenous Contrast Given 03/22/23 2232)    (Note:  hospital course my include additional interventions and/or labs/studies not listed above.)   Patient is generally well-appearing and in no obvious distress.  Vital signs are stable.  Physical exam notable for some right lower quadrant pain with some guarding.  Labs are all within normal limits other than a slight elevation of his BUN.  No leukocytosis.  CT scan as documented above was reassuring and showed no evidence of acute abnormality.  I recommended to the patient that he try some over-the-counter PPI and establish primary  care doctor.  I gave my usual and customary return precautions.         FINAL CLINICAL IMPRESSION(S) / ED DIAGNOSES   Final diagnoses:  Lower abdominal pain     Rx / DC Orders   ED Discharge Orders          Ordered    omeprazole (PRILOSEC OTC) 20 MG tablet  Daily        03/22/23 2323             Note:  This document was prepared using Dragon voice recognition software and may include unintentional dictation errors.   Loleta Rose, MD 03/23/23 0040

## 2023-03-22 NOTE — ED Provider Triage Note (Signed)
Emergency Medicine Provider Triage Evaluation Note  Chad Marker Sr. , a 42 y.o. male  was evaluated in triage.  Pt complains of abd pain and BRBPR. Has had multiple episodes over the last couple days. Reprots that it fills the toilet. Feels tired. No vomiting. Has happened once before. No prior colonoscopy. No history of colon cancer.  Review of Systems  Positive: Abd pain, BRBPR Negative: Fever, vomiting  Physical Exam  There were no vitals taken for this visit. Gen:   Awake, no distress   Resp:  Normal effort  MSK:   Moves extremities without difficulty  Other:    Medical Decision Making  Medically screening exam initiated at 5:19 PM.  Appropriate orders placed.  Chad Marker Sr. was informed that the remainder of the evaluation will be completed by another provider, this initial triage assessment does not replace that evaluation, and the importance of remaining in the ED until their evaluation is complete.     Jackelyn Hoehn, PA-C 03/22/23 1723

## 2023-03-22 NOTE — Discharge Instructions (Signed)
